# Patient Record
Sex: Female | Born: 1945 | Race: White | Hispanic: No | Smoking: Never smoker
Health system: Southern US, Community
[De-identification: ages and names within clinical notes are randomized; demographics above are authoritative.]

## PROBLEM LIST (undated history)

## (undated) DIAGNOSIS — E785 Hyperlipidemia, unspecified: Secondary | ICD-10-CM

## (undated) DIAGNOSIS — I10 Essential (primary) hypertension: Secondary | ICD-10-CM

## (undated) DIAGNOSIS — F419 Anxiety disorder, unspecified: Secondary | ICD-10-CM

## (undated) DIAGNOSIS — F988 Other specified behavioral and emotional disorders with onset usually occurring in childhood and adolescence: Secondary | ICD-10-CM

## (undated) DIAGNOSIS — Z8249 Family history of ischemic heart disease and other diseases of the circulatory system: Secondary | ICD-10-CM

## (undated) DIAGNOSIS — Z973 Presence of spectacles and contact lenses: Secondary | ICD-10-CM

## (undated) DIAGNOSIS — Z9289 Personal history of other medical treatment: Secondary | ICD-10-CM

## (undated) HISTORY — PX: MOUTH SURGERY: SHX715

## (undated) HISTORY — DX: Hyperlipidemia, unspecified: E78.5

## (undated) HISTORY — DX: Essential (primary) hypertension: I10

## (undated) HISTORY — DX: Family history of ischemic heart disease and other diseases of the circulatory system: Z82.49

## (undated) HISTORY — DX: Personal history of other medical treatment: Z92.89

## (undated) HISTORY — PX: TUBAL LIGATION: SHX77

## (undated) HISTORY — PX: COLONOSCOPY: SHX174

## (undated) HISTORY — DX: Other specified behavioral and emotional disorders with onset usually occurring in childhood and adolescence: F98.8

## (undated) HISTORY — DX: Anxiety disorder, unspecified: F41.9

---

## 1999-06-28 ENCOUNTER — Other Ambulatory Visit: Admission: RE | Admit: 1999-06-28 | Discharge: 1999-06-28 | Payer: Self-pay | Admitting: Gynecology

## 1999-12-14 ENCOUNTER — Emergency Department (HOSPITAL_COMMUNITY): Admission: EM | Admit: 1999-12-14 | Discharge: 1999-12-14 | Payer: Self-pay | Admitting: Emergency Medicine

## 1999-12-17 ENCOUNTER — Emergency Department (HOSPITAL_COMMUNITY): Admission: EM | Admit: 1999-12-17 | Discharge: 1999-12-17 | Payer: Self-pay | Admitting: Emergency Medicine

## 1999-12-21 ENCOUNTER — Encounter (HOSPITAL_COMMUNITY): Admission: RE | Admit: 1999-12-21 | Discharge: 2000-03-20 | Payer: Self-pay | Admitting: Emergency Medicine

## 2000-03-16 ENCOUNTER — Other Ambulatory Visit: Admission: RE | Admit: 2000-03-16 | Discharge: 2000-03-16 | Payer: Self-pay | Admitting: Gynecology

## 2000-08-20 ENCOUNTER — Encounter: Payer: Self-pay | Admitting: Orthopedic Surgery

## 2000-08-20 ENCOUNTER — Ambulatory Visit (HOSPITAL_COMMUNITY): Admission: RE | Admit: 2000-08-20 | Discharge: 2000-08-20 | Payer: Self-pay | Admitting: Orthopedic Surgery

## 2001-03-12 ENCOUNTER — Other Ambulatory Visit: Admission: RE | Admit: 2001-03-12 | Discharge: 2001-03-12 | Payer: Self-pay | Admitting: Gynecology

## 2001-04-09 ENCOUNTER — Ambulatory Visit (HOSPITAL_BASED_OUTPATIENT_CLINIC_OR_DEPARTMENT_OTHER): Admission: RE | Admit: 2001-04-09 | Discharge: 2001-04-09 | Payer: Self-pay | Admitting: Orthopedic Surgery

## 2002-05-12 ENCOUNTER — Other Ambulatory Visit: Admission: RE | Admit: 2002-05-12 | Discharge: 2002-05-12 | Payer: Self-pay | Admitting: Gynecology

## 2003-01-30 ENCOUNTER — Ambulatory Visit (HOSPITAL_COMMUNITY): Admission: RE | Admit: 2003-01-30 | Discharge: 2003-01-30 | Payer: Self-pay | Admitting: Gastroenterology

## 2003-06-11 ENCOUNTER — Ambulatory Visit (HOSPITAL_COMMUNITY): Admission: RE | Admit: 2003-06-11 | Discharge: 2003-06-11 | Payer: Self-pay | Admitting: Gynecology

## 2003-07-15 ENCOUNTER — Other Ambulatory Visit: Admission: RE | Admit: 2003-07-15 | Discharge: 2003-07-15 | Payer: Self-pay | Admitting: Gynecology

## 2003-07-25 HISTORY — PX: TRIGGER FINGER RELEASE: SHX641

## 2004-07-22 ENCOUNTER — Other Ambulatory Visit: Admission: RE | Admit: 2004-07-22 | Discharge: 2004-07-22 | Payer: Self-pay | Admitting: Gynecology

## 2005-06-29 ENCOUNTER — Other Ambulatory Visit: Admission: RE | Admit: 2005-06-29 | Discharge: 2005-06-29 | Payer: Self-pay | Admitting: Gynecology

## 2006-04-17 ENCOUNTER — Other Ambulatory Visit: Admission: RE | Admit: 2006-04-17 | Discharge: 2006-04-17 | Payer: Self-pay | Admitting: Gynecology

## 2010-10-23 DIAGNOSIS — Z9289 Personal history of other medical treatment: Secondary | ICD-10-CM

## 2010-10-23 HISTORY — DX: Personal history of other medical treatment: Z92.89

## 2011-08-02 ENCOUNTER — Encounter: Payer: Self-pay | Admitting: Obstetrics and Gynecology

## 2013-04-22 ENCOUNTER — Encounter: Payer: Self-pay | Admitting: Cardiovascular Disease

## 2013-04-24 ENCOUNTER — Telehealth: Payer: Self-pay | Admitting: *Deleted

## 2013-04-24 ENCOUNTER — Telehealth: Payer: Self-pay | Admitting: Cardiovascular Disease

## 2013-04-24 NOTE — Telephone Encounter (Signed)
Returned patient's phone call in reference to her wellbutrin request. Informed her that Dr. Alanda Amass is no longer available therefore we cannot change her medication to brand medication. A doctor would need to do this. She requested for JC to do this . I informed her that JC could not do this. She will need to come in for appointment to see someone here or go to her PCP.

## 2013-04-24 NOTE — Telephone Encounter (Signed)
Wants a prescription called in to [facility] for the brand name Wellbutrin  300mg . She has the generic form of that and its not working . Wants to try to name brand.. Please call fher at [phone].Marland Kitchen

## 2013-06-13 ENCOUNTER — Telehealth: Payer: Self-pay | Admitting: Cardiovascular Disease

## 2013-06-13 NOTE — Telephone Encounter (Signed)
Calling about Simvastatin about the dosage and also have other questions .Marland Kitchen Would like to speak to [facility] ... Please call   Thanks

## 2013-06-13 NOTE — Telephone Encounter (Signed)
Returned call.  Left message to call back Monday before 4pm.  JC, LPN not available.

## 2013-07-22 ENCOUNTER — Other Ambulatory Visit: Payer: Self-pay | Admitting: *Deleted

## 2013-07-22 ENCOUNTER — Telehealth: Payer: Self-pay | Admitting: Cardiovascular Disease

## 2013-07-22 ENCOUNTER — Telehealth: Payer: Self-pay | Admitting: *Deleted

## 2013-07-22 MED ORDER — VALSARTAN 160 MG PO TABS: 80.0000 mg | ORAL_TABLET | Freq: Every day | ORAL | Status: DC

## 2013-07-22 MED ORDER — SIMVASTATIN 40 MG PO TABS: 60.0000 mg | ORAL_TABLET | Freq: Every day | ORAL | Status: DC

## 2013-07-22 NOTE — Telephone Encounter (Signed)
Please have J C To call her-wants to make sure he got her refill from the pharmacist.Need to have it filled by tomorrow.

## 2013-07-22 NOTE — Telephone Encounter (Signed)
Spoke with patient to clarify med doses. Also stated needed simvastatin refill.   Requested Ambien (paper fax) and Wellbutrin (while on phone) and instructed patient that new cardiologist may not prescribe these and should likely be defered to PCP.

## 2013-07-22 NOTE — Telephone Encounter (Signed)
Left VM for patient to call back with diovan dose for refill.. Noted in charge 80mg  QD, refill came in for 160mg  QD

## 2013-07-23 ENCOUNTER — Other Ambulatory Visit: Payer: Self-pay | Admitting: *Deleted

## 2013-07-23 NOTE — Telephone Encounter (Signed)
Per Medical Records: "Chart [id number] RX received from pharmacy, will locate chart and put on RX cart. ST"  Message forwarded to [facility]. Berlinda Last, LPN.

## 2013-07-23 NOTE — Telephone Encounter (Signed)
On Diovan Rx sent to [facility] yesterday the dosage is wrong.  Please check and correct.  Please call

## 2013-07-23 NOTE — Telephone Encounter (Signed)
Pt. Called no answer left message on machine

## 2013-07-23 NOTE — Telephone Encounter (Signed)
Returned call and pt verified x 2.  Pt informed message received and per note on yesterday, she spoke w/ Eileen Stanford, RN who clarified current dose.  Pt stated she has to take the brand name and it cost $140.  Stated JC usually sends in script for 160 mg daily (Diovan).  Pt informed RN can only send in script for current dose and cannot change directions on Rx w/o an order.  Pt would like JC to be notified to send in script for 160 mg daily.  Pt informed he will be notified.  Pt verbalized understanding and agreed w/ plan.  Message forwarded to [facility]. Berlinda Last, LPN.

## 2013-10-02 ENCOUNTER — Telehealth: Payer: Self-pay | Admitting: Gynecology

## 2013-10-02 NOTE — Telephone Encounter (Signed)
LMTCB for appt.  (Overdue for Aex. Last was 06-14-12 with TL.)

## 2013-10-02 NOTE — Telephone Encounter (Signed)
Patient requesting an RX from Dr. Farrel GobbleLathrop for Diovan 160 mg to be taken once daily. She used to get this RX from Dr. Jolayne HainesWeintraud but he recently retired. Patient states she is almost out and needs this as soon as possible in 90 day increments.   [facility]  [place]

## 2013-10-02 NOTE — Telephone Encounter (Signed)
Spoke with pt who has Aex appt scheduled for 10-13-13 at 6 pm with TL. Pt is about to run out of Diovan 160 mg and would like TL to refill if possible. Pt states, "Dr. Farrel GobbleLathrop knows about this and that Dr. Alanda AmassWeintraub is retired."  Dr. Farrel GobbleLathrop, do you want to refill? Pt would like 90 day refill.

## 2013-10-06 ENCOUNTER — Other Ambulatory Visit: Payer: Self-pay | Admitting: Orthopedic Surgery

## 2013-10-06 NOTE — Telephone Encounter (Signed)
As i can tell, she got a 90d rx from Dr Allyson SabalBerry in December and there were 2 refills, so she should have enough.  I am not a cardiologist and would have filled her so she didn't have a gap but Dr Allyson SabalBerry is cardiology, so she should refer to him.  This is not an Rx i have ever prescribed.

## 2013-10-06 NOTE — Telephone Encounter (Signed)
Spoke with pt about refills from Dr. Allyson SabalBerry, and pt not aware as she has never seen him before. Pt will call about refills and is appreciative.

## 2013-10-08 ENCOUNTER — Encounter: Payer: Self-pay | Admitting: Gynecology

## 2013-10-13 ENCOUNTER — Encounter: Payer: Self-pay | Admitting: Gynecology

## 2013-10-13 ENCOUNTER — Ambulatory Visit (INDEPENDENT_AMBULATORY_CARE_PROVIDER_SITE_OTHER): Payer: Medicare Other | Admitting: Gynecology

## 2013-10-13 VITALS — BP 138/70 | HR 71 | Resp 20 | Ht 63.5 in | Wt 195.0 lb

## 2013-10-13 DIAGNOSIS — Z780 Asymptomatic menopausal state: Secondary | ICD-10-CM

## 2013-10-13 DIAGNOSIS — G4700 Insomnia, unspecified: Secondary | ICD-10-CM

## 2013-10-13 DIAGNOSIS — R635 Abnormal weight gain: Secondary | ICD-10-CM

## 2013-10-13 DIAGNOSIS — Z124 Encounter for screening for malignant neoplasm of cervix: Secondary | ICD-10-CM

## 2013-10-13 DIAGNOSIS — E7800 Pure hypercholesterolemia, unspecified: Secondary | ICD-10-CM | POA: Insufficient documentation

## 2013-10-13 DIAGNOSIS — Z01419 Encounter for gynecological examination (general) (routine) without abnormal findings: Secondary | ICD-10-CM

## 2013-10-13 DIAGNOSIS — F4323 Adjustment disorder with mixed anxiety and depressed mood: Secondary | ICD-10-CM

## 2013-10-13 DIAGNOSIS — Z0000 Encounter for general adult medical examination without abnormal findings: Secondary | ICD-10-CM

## 2013-10-13 DIAGNOSIS — I10 Essential (primary) hypertension: Secondary | ICD-10-CM

## 2013-10-13 LAB — POCT URINALYSIS DIPSTICK
Bilirubin, UA: NEGATIVE
Glucose, UA: NEGATIVE
Ketones, UA: NEGATIVE
NITRITE UA: NEGATIVE
PROTEIN UA: NEGATIVE
UROBILINOGEN UA: NEGATIVE
pH, UA: 5

## 2013-10-13 MED ORDER — BUPROPION HCL ER (XL) 300 MG PO TB24: 300.0000 mg | ORAL_TABLET | Freq: Every day | ORAL | Status: DC

## 2013-10-13 MED ORDER — VALSARTAN 160 MG PO TABS: 160.0000 mg | ORAL_TABLET | Freq: Every day | ORAL | Status: DC

## 2013-10-13 MED ORDER — ZOLPIDEM TARTRATE 10 MG PO TABS: 10.0000 mg | ORAL_TABLET | Freq: Every evening | ORAL | Status: DC | PRN

## 2013-10-13 NOTE — Patient Instructions (Signed)

## 2013-10-13 NOTE — Progress Notes (Signed)
68 y.o. @Divorsed  @Caucasian  female   [phone] here for annual exam. Pt reports menses are not regular.  She does not report hot flashes, does not have night sweats, does not have vaginal dryness.  She is not using lubricants.  She does not report post-menopasual bleeding.  Pt reports that she does not have refills on her medications since her cardiologist retired, she brought in the bottles to show the doses.  Pt requests fasting labs.  She has been under a lot of stress with her son who has been repeatedly hospitalized over the last few weeks.  No LMP recorded. Patient is postmenopausal.          Sexually active: no  The current method of family planning is none.    Exercising: yes  Cardio Last pap: 05/2012. HR HPV not tested Abnormal PAP: yes Mammogram: 12/14 - BI-RADS 1 : NEG BSE: None Colonoscopy: 2011 - every 7 years DEXA: 2010 Alcohol: none Tobacco: none  Health Maintenance  Topic Date Due  . Zostavax  06/07/2004  . Pneumococcal Polysaccharide Vaccine Age 57 And Over  06/07/2009  . Influenza Vaccine  02/21/2013  . Mammogram  07/02/2015  . Colonoscopy  07/25/2019  . Tetanus/tdap  06/14/2022    Family History  Problem Relation Age of Onset  . Non-Hodgkin's lymphoma    . Diabetes Mellitus II    . Diabetes Mother     There are no active problems to display for this patient.   Past Medical History  Diagnosis Date  . Hypertension   . Anxiety   . Hypertension     History reviewed. No pertinent past surgical history.  Allergies: Tetracyclines & related  Current Outpatient Prescriptions  Medication Sig Dispense Refill  . buPROPion (WELLBUTRIN XL) 300 MG 24 hr tablet Take 300 mg by mouth daily.      . calcium gluconate 650 MG tablet Take 650 mg by mouth daily.      . simvastatin (ZOCOR) 40 MG tablet Take 1.5 tablets (60 mg total) by mouth at bedtime.  135 tablet  2  . valsartan (DIOVAN) 160 MG tablet Take 0.5 tablets (80 mg total) by mouth daily.  45 tablet  2  .  ergocalciferol (VITAMIN D2) 50000 UNITS capsule Take 50,000 Units by mouth once a week.      . fluticasone (CUTIVATE) 0.005 % ointment Apply 1 application topically daily.        No current facility-administered medications for this visit.    ROS: Pertinent items are noted in HPI.  Exam:    BP 158/68  Pulse 71  Resp 20  Ht 5' 3.5" (1.613 m)  Wt 195 lb (88.451 kg)  BMI 34.00 kg/m2 Weight change: @WEIGHTCHANGE @ Last 3 height recordings:  Ht Readings from Last 3 Encounters:  10/13/13 5' 3.5" (1.613 m)   General appearance: alert, cooperative and appears stated age Head: Normocephalic, without obvious abnormality, atraumatic Neck: no adenopathy, no carotid bruit, no JVD, supple, symmetrical, trachea midline and thyroid not enlarged, symmetric, no tenderness/mass/nodules Lungs: clear to auscultation bilaterally Breasts: normal appearance, no masses or tenderness Heart: regular rate and rhythm, S1, S2 normal, no murmur, click, rub or gallop Abdomen: soft, non-tender; bowel sounds normal; no masses,  no organomegaly Extremities: extremities normal, atraumatic, no cyanosis or edema Skin: Skin color, texture, turgor normal. No rashes or lesions Lymph nodes: Cervical, supraclavicular, and axillary nodes normal. no inguinal nodes palpated Neurologic: Grossly normal   Pelvic: External genitalia:  no lesions  Urethra: normal appearing urethra with no masses, tenderness or lesions              Bartholins and Skenes: normal                 Vagina: normal appearing vagina with normal color and discharge, no lesions              Cervix: normal appearance              Pap taken: no        Bimanual Exam:  Uterus:  uterus is normal size, shape, consistency and nontender                                      Adnexa:    no masses                                      Rectovaginal: Confirms                                      Anus:  normal sphincter tone, no lesions  A: well  woman HTN Hypercholesterolemia Family history of heart disease and early death     P: mammogram pap smear not done Repeat BP at end of visit 138/ Refill of diovan, wellbutrin and ambien sent in  rto for fasting LP, CBC, CMP DEXA counseled on breast self exam, mammography screening, use and side effects of OCP's, adequate intake of calcium and vitamin D, diet and exercise return annually or prn Discussed PAP guideline changes, importance of weight bearing exercises, calcium, vit D and balanced diet.  An After Visit Summary was printed and given to the patient.

## 2013-10-17 ENCOUNTER — Other Ambulatory Visit (INDEPENDENT_AMBULATORY_CARE_PROVIDER_SITE_OTHER): Payer: Medicare Other

## 2013-10-17 DIAGNOSIS — E7800 Pure hypercholesterolemia, unspecified: Secondary | ICD-10-CM

## 2013-10-17 DIAGNOSIS — R635 Abnormal weight gain: Secondary | ICD-10-CM

## 2013-10-17 LAB — COMPREHENSIVE METABOLIC PANEL
ALT: 19 U/L (ref 0–35)
AST: 19 U/L (ref 0–37)
Albumin: 3.9 g/dL (ref 3.5–5.2)
Alkaline Phosphatase: 79 U/L (ref 39–117)
BILIRUBIN TOTAL: 0.3 mg/dL (ref 0.2–1.2)
BUN: 13 mg/dL (ref 6–23)
CO2: 28 meq/L (ref 19–32)
CREATININE: 0.77 mg/dL (ref 0.50–1.10)
Calcium: 9.5 mg/dL (ref 8.4–10.5)
Chloride: 102 mEq/L (ref 96–112)
Glucose, Bld: 84 mg/dL (ref 70–99)
Potassium: 4.7 mEq/L (ref 3.5–5.3)
Sodium: 139 mEq/L (ref 135–145)
Total Protein: 6.8 g/dL (ref 6.0–8.3)

## 2013-10-17 LAB — HEPATIC FUNCTION PANEL
ALT: 19 U/L (ref 0–35)
AST: 19 U/L (ref 0–37)
Albumin: 3.9 g/dL (ref 3.5–5.2)
Alkaline Phosphatase: 79 U/L (ref 39–117)
Bilirubin, Direct: 0.1 mg/dL (ref 0.0–0.3)
Indirect Bilirubin: 0.2 mg/dL (ref 0.2–1.2)
TOTAL PROTEIN: 6.8 g/dL (ref 6.0–8.3)
Total Bilirubin: 0.3 mg/dL (ref 0.2–1.2)

## 2013-10-17 LAB — LIPID PANEL
CHOL/HDL RATIO: 2.8 ratio
Cholesterol: 141 mg/dL (ref 0–200)
HDL: 51 mg/dL (ref >39)
LDL Cholesterol: 67 mg/dL (ref 0–99)
Triglycerides: 116 mg/dL (ref <150)
VLDL: 23 mg/dL (ref 0–40)

## 2013-10-17 MED ORDER — ZOLPIDEM TARTRATE 10 MG PO TABS: 10.0000 mg | ORAL_TABLET | Freq: Every evening | ORAL | Status: DC | PRN

## 2013-10-17 NOTE — Addendum Note (Signed)
Addended by: Douglass RiversLATHROP, Mercedes Fort on: 10/17/2013 09:05 AM   Modules accepted: Orders, Medications

## 2013-10-18 LAB — TSH: TSH: 3.293 u[IU]/mL (ref 0.350–4.500)

## 2013-10-20 ENCOUNTER — Telehealth: Payer: Self-pay | Admitting: *Deleted

## 2013-10-20 NOTE — Telephone Encounter (Signed)
Left Message To Call Back  

## 2013-10-20 NOTE — Telephone Encounter (Signed)
Message copied by Lorraine LaxSHAW, Arley Garant J on Mon Oct 20, 2013  2:22 PM ------      Message from: Douglass RiversLATHROP, TRACY      Created: Mon Oct 20, 2013  1:17 PM       CMET, TSH, CHOL AND LFT'S all excellent, pls inform ------

## 2013-10-27 NOTE — Telephone Encounter (Signed)
Patient notified see result note 

## 2013-12-02 ENCOUNTER — Telehealth: Payer: Self-pay | Admitting: Cardiovascular Disease

## 2013-12-02 MED ORDER — SIMVASTATIN 40 MG PO TABS: ORAL_TABLET | ORAL | Status: DC

## 2013-12-02 MED ORDER — SIMVASTATIN 20 MG PO TABS: ORAL_TABLET | ORAL | Status: DC

## 2013-12-02 NOTE — Telephone Encounter (Signed)
Her insurance is now  Requiring  2 prescriptions for her Simvastatin. She needs 2 different prescriptions, one for 20 mg and one for 40 mg please. Please call.

## 2013-12-02 NOTE — Telephone Encounter (Signed)
Patient takes 60mg  simvastatin, Rx'ed by Dr. Alanda AmassWeintraub. Insurance will not longer cover 1.5 tablets of 40mg  daily, so she needs a Rx for 40mg  and Rx for 20mg .  She will be seeing Dr. Allyson SabalBerry and per patient she is a yearly visit patient (no recall in system so will make scheduler aware)

## 2013-12-04 ENCOUNTER — Other Ambulatory Visit: Payer: Self-pay | Admitting: *Deleted

## 2013-12-04 NOTE — Telephone Encounter (Signed)
Pharmacy calling to get an order for the Simvastatin 40mg  daily.  Already received the refill for the 20mg .

## 2013-12-11 ENCOUNTER — Telehealth: Payer: Self-pay | Admitting: Cardiovascular Disease

## 2013-12-11 NOTE — Telephone Encounter (Signed)
Closed encounter °

## 2013-12-12 ENCOUNTER — Other Ambulatory Visit: Payer: Self-pay | Admitting: *Deleted

## 2013-12-12 DIAGNOSIS — I10 Essential (primary) hypertension: Secondary | ICD-10-CM

## 2013-12-12 NOTE — Telephone Encounter (Addendum)
[facility] requesting Valsartan.  Last AEX and refill 10/13/2013 #90/ 0 refills  Please approve or deny Rx.

## 2013-12-16 NOTE — Telephone Encounter (Signed)
Patient states she has enough Rx until her appt with new cardiologist.  - Patient requesting call from Dr. Farrel Gobble. She states it is something personal.

## 2013-12-16 NOTE — Telephone Encounter (Signed)
This medication is not appropriate for me to refill, done as courtesy as her cardiologist had retired and she had no refill, needs to contact prior office for new cardiologist to see

## 2014-04-12 ENCOUNTER — Other Ambulatory Visit: Payer: Self-pay | Admitting: Cardiovascular Disease

## 2014-04-13 NOTE — Telephone Encounter (Signed)
Spoke to patient she stated she needed simvastatin refilled.Advised she has not seen a Dr.since Dr.Weintraub retired.Pt requested Dr.Berry.Appointment scheduled with Dr.Berry 06/12/14 at 11:30 am. Pt will come fasting to appt so she can have lab work too.Simvastatin refill sent to pharmacy.

## 2014-04-14 ENCOUNTER — Other Ambulatory Visit: Payer: Self-pay | Admitting: *Deleted

## 2014-04-30 ENCOUNTER — Other Ambulatory Visit: Payer: Self-pay | Admitting: *Deleted

## 2014-04-30 MED ORDER — SIMVASTATIN 40 MG PO TABS: ORAL_TABLET | ORAL | Status: DC

## 2014-04-30 NOTE — Telephone Encounter (Signed)
Medication quanbtity adjusted per request and sent electronically to the pharmacy with notation: MUST keep appointment for additional refills.

## 2014-05-25 ENCOUNTER — Encounter: Payer: Self-pay | Admitting: Gynecology

## 2014-05-26 ENCOUNTER — Other Ambulatory Visit: Payer: Self-pay | Admitting: Cardiovascular Disease

## 2014-06-10 ENCOUNTER — Encounter: Payer: Self-pay | Admitting: *Deleted

## 2014-06-12 ENCOUNTER — Ambulatory Visit (INDEPENDENT_AMBULATORY_CARE_PROVIDER_SITE_OTHER): Payer: Medicare Other | Admitting: Cardiovascular Disease

## 2014-06-12 ENCOUNTER — Encounter: Payer: Self-pay | Admitting: Cardiovascular Disease

## 2014-06-12 VITALS — BP 158/62 | HR 72 | Ht 64.0 in | Wt 196.0 lb

## 2014-06-12 DIAGNOSIS — Z8249 Family history of ischemic heart disease and other diseases of the circulatory system: Secondary | ICD-10-CM

## 2014-06-12 DIAGNOSIS — G4700 Insomnia, unspecified: Secondary | ICD-10-CM

## 2014-06-12 DIAGNOSIS — F4323 Adjustment disorder with mixed anxiety and depressed mood: Secondary | ICD-10-CM

## 2014-06-12 DIAGNOSIS — E7800 Pure hypercholesterolemia, unspecified: Secondary | ICD-10-CM

## 2014-06-12 DIAGNOSIS — I10 Essential (primary) hypertension: Secondary | ICD-10-CM

## 2014-06-12 MED ORDER — SIMVASTATIN 40 MG PO TABS: ORAL_TABLET | ORAL | Status: DC

## 2014-06-12 MED ORDER — ZOLPIDEM TARTRATE 10 MG PO TABS: 10.0000 mg | ORAL_TABLET | Freq: Every evening | ORAL | Status: DC | PRN

## 2014-06-12 MED ORDER — SIMVASTATIN 20 MG PO TABS: ORAL_TABLET | ORAL | Status: DC

## 2014-06-12 MED ORDER — VALSARTAN 160 MG PO TABS: 160.0000 mg | ORAL_TABLET | Freq: Every day | ORAL | Status: DC

## 2014-06-12 MED ORDER — BUPROPION HCL ER (XL) 300 MG PO TB24: 300.0000 mg | ORAL_TABLET | Freq: Every day | ORAL | Status: AC

## 2014-06-12 NOTE — Patient Instructions (Signed)
Your physician wants you to follow-up in 6 months with Dr. Berry. You will receive a reminder letter in the mail 2 months in advance. If you do not receive a letter, please call our office to schedule the follow-up appointment.  

## 2014-06-12 NOTE — Assessment & Plan Note (Signed)
She has a strong family history of heart disease with a brother that died at age 70 of a myocardial infarction and a mother who had an MI as well. She has never had a heart attack or stroke and denies chest pain or shortness of breath. She has had several negative stress test in the past.

## 2014-06-12 NOTE — Assessment & Plan Note (Signed)
History of hypokalemia on simvastatin 40 mg daily. Her recent lipid profile performed 10/17/13 revealed a total cholesterol of 141, LDL 67 and HDL of 51.

## 2014-06-12 NOTE — Progress Notes (Signed)
06/12/2014 Tiffany ScoreArlene Dugue   1946-02-04  [id number]  Primary Physician No PCP Per Patient Primary Cardiologist: Runell GessJonathan J. Berry MD Roseanne RenoFACP,FACC,FAHA, FSCAI   HPI:  Tiffany Prince is a very pleasant 68 year old moderately overweight single Caucasian female mother of 3 children, grandmother and 4 grandchildren who works as an Advertising account plannerinsurance agent. She was. A patient of Dr. Rocco Sereneichard Weintraub's. I'm assuming her care. She has no past medical cardiac history. Her risk factors include treated hypertension and hyperlipidemia. She does have a family history of heart disease with a brother who died of a myocardial infarction at age 744 and a mother who had an MI as well. She has never had a heart attack or stroke. She denies chest pain or shortness of breath. Her last Myoview stress test performed 04/15/10 was nonischemic.   Current Outpatient Prescriptions  Medication Sig Dispense Refill  . buPROPion (WELLBUTRIN XL) 300 MG 24 hr tablet Take 1 tablet (300 mg total) by mouth daily. 90 tablet 3  . calcium gluconate 650 MG tablet Take 650 mg by mouth daily.    . diclofenac (VOLTAREN) 75 MG EC tablet     . ergocalciferol (VITAMIN D2) 50000 UNITS capsule Take 50,000 Units by mouth once a week.    . fluticasone (CUTIVATE) 0.005 % ointment Apply 1 application topically daily.     . simvastatin (ZOCOR) 20 MG tablet Take 1 tablet (20mg ) in addition to 1-40mg  tablet (60mg  total) once daily by mouth for cholesterol. 90 tablet 1  . simvastatin (ZOCOR) 40 MG tablet Take one tablet by mouth one time daily. MUST keep appointment for additional refills. 90 tablet 0  . valsartan (DIOVAN) 160 MG tablet Take 1 tablet (160 mg total) by mouth daily. 90 tablet 0  . zolpidem (AMBIEN) 10 MG tablet Take 1 tablet (10 mg total) by mouth at bedtime as needed for sleep. 90 tablet 1   No current facility-administered medications for this visit.    Allergies  Allergen Reactions  . Tetracyclines & Related     History   Social History   . Marital Status: Divorced    Spouse Name: N/A    Number of Children: N/A  . Years of Education: N/A   Occupational History  . Not on file.   Social History Main Topics  . Smoking status: Never Smoker   . Smokeless tobacco: Never Used     Comment: pt stated that she smoked 50 years ago, and barely  . Alcohol Use: No  . Drug Use: No  . Sexual Activity: Not Currently   Other Topics Concern  . Not on file   Social History Narrative     Review of Systems: General: negative for chills, fever, night sweats or weight changes.  Cardiovascular: negative for chest pain, dyspnea on exertion, edema, orthopnea, palpitations, paroxysmal nocturnal dyspnea or shortness of breath Dermatological: negative for rash Respiratory: negative for cough or wheezing Urologic: negative for hematuria Abdominal: negative for nausea, vomiting, diarrhea, bright red blood per rectum, melena, or hematemesis Neurologic: negative for visual changes, syncope, or dizziness All other systems reviewed and are otherwise negative except as noted above.    Blood pressure 158/62, pulse 72, height 5\' 4"  (1.626 m), weight 196 lb (88.905 kg).  General appearance: alert and no distress Neck: no adenopathy, no carotid bruit, no JVD, supple, symmetrical, trachea midline and thyroid not enlarged, symmetric, no tenderness/mass/nodules Lungs: clear to auscultation bilaterally Heart: regular rate and rhythm, S1, S2 normal, no murmur, click, rub or gallop  Extremities: extremities normal, atraumatic, no cyanosis or edema and 2+ pedal pulses bilaterally  EKG normal sinus rhythm at 72 without ST or T-wave changes. I personally reviewed this EKG  ASSESSMENT AND PLAN:   Essential hypertension, benign History of hypertension with blood pressure measured today 150/62. She is on valsartan 160 mg. Continue patient at current dosing  Pure hypercholesterolemia History of hypokalemia on simvastatin 40 mg daily. Her recent lipid  profile performed 10/17/13 revealed a total cholesterol of 141, LDL 67 and HDL of 51.  Family history of heart disease She has a strong family history of heart disease with a brother that died at age 70 of a myocardial infarction and a mother who had an MI as well. She has never had a heart attack or stroke and denies chest pain or shortness of breath. She has had several negative stress test in the past.      Runell GessJonathan J. Berry MD [facility] LLCFACP,FACC,FAHA, [facility] HospitalFSCAI 06/12/2014 12:03 PM

## 2014-06-12 NOTE — Assessment & Plan Note (Signed)
History of hypertension with blood pressure measured today 150/62. She is on valsartan 160 mg. Continue patient at current dosing

## 2014-07-02 ENCOUNTER — Other Ambulatory Visit: Payer: Self-pay | Admitting: Orthopedic Surgery

## 2014-07-06 ENCOUNTER — Encounter (HOSPITAL_BASED_OUTPATIENT_CLINIC_OR_DEPARTMENT_OTHER): Payer: Self-pay | Admitting: *Deleted

## 2014-07-06 NOTE — Progress Notes (Signed)
No pcp-sees dr berry for htn Stress test 9/11 normal Needs istat Pt very busy insurance agant

## 2014-07-07 ENCOUNTER — Ambulatory Visit (HOSPITAL_BASED_OUTPATIENT_CLINIC_OR_DEPARTMENT_OTHER): Payer: Medicare Other | Admitting: Anesthesiology

## 2014-07-07 ENCOUNTER — Encounter (HOSPITAL_BASED_OUTPATIENT_CLINIC_OR_DEPARTMENT_OTHER): Payer: Self-pay | Admitting: Orthopedic Surgery

## 2014-07-07 ENCOUNTER — Encounter (HOSPITAL_BASED_OUTPATIENT_CLINIC_OR_DEPARTMENT_OTHER)
Admission: RE | Disposition: A | Discharge status: Home / Self Care | Payer: Self-pay | Source: Ambulatory Visit | Attending: Orthopedic Surgery

## 2014-07-07 ENCOUNTER — Ambulatory Visit (HOSPITAL_BASED_OUTPATIENT_CLINIC_OR_DEPARTMENT_OTHER)
Admission: RE | Admit: 2014-07-07 | Discharge: 2014-07-07 | Disposition: A | Discharge status: Home / Self Care | Payer: Medicare Other | Source: Ambulatory Visit | Attending: Orthopedic Surgery | Admitting: Orthopedic Surgery

## 2014-07-07 DIAGNOSIS — Z8249 Family history of ischemic heart disease and other diseases of the circulatory system: Secondary | ICD-10-CM | POA: Insufficient documentation

## 2014-07-07 DIAGNOSIS — F4323 Adjustment disorder with mixed anxiety and depressed mood: Secondary | ICD-10-CM | POA: Diagnosis not present

## 2014-07-07 DIAGNOSIS — M5430 Sciatica, unspecified side: Secondary | ICD-10-CM | POA: Diagnosis not present

## 2014-07-07 DIAGNOSIS — I10 Essential (primary) hypertension: Secondary | ICD-10-CM | POA: Diagnosis not present

## 2014-07-07 DIAGNOSIS — Z880 Allergy status to penicillin: Secondary | ICD-10-CM | POA: Insufficient documentation

## 2014-07-07 DIAGNOSIS — G4700 Insomnia, unspecified: Secondary | ICD-10-CM | POA: Diagnosis not present

## 2014-07-07 DIAGNOSIS — M65842 Other synovitis and tenosynovitis, left hand: Secondary | ICD-10-CM | POA: Insufficient documentation

## 2014-07-07 DIAGNOSIS — Z833 Family history of diabetes mellitus: Secondary | ICD-10-CM | POA: Insufficient documentation

## 2014-07-07 DIAGNOSIS — F419 Anxiety disorder, unspecified: Secondary | ICD-10-CM | POA: Insufficient documentation

## 2014-07-07 DIAGNOSIS — E785 Hyperlipidemia, unspecified: Secondary | ICD-10-CM | POA: Diagnosis not present

## 2014-07-07 DIAGNOSIS — M659 Synovitis and tenosynovitis, unspecified: Secondary | ICD-10-CM | POA: Insufficient documentation

## 2014-07-07 DIAGNOSIS — M65342 Trigger finger, left ring finger: Secondary | ICD-10-CM | POA: Diagnosis present

## 2014-07-07 HISTORY — DX: Presence of spectacles and contact lenses: Z97.3

## 2014-07-07 HISTORY — PX: TRIGGER FINGER RELEASE: SHX641

## 2014-07-07 LAB — POCT I-STAT, CHEM 8
BUN: 18 mg/dL (ref 6–23)
CALCIUM ION: 1.24 mmol/L (ref 1.13–1.30)
Chloride: 103 mEq/L (ref 96–112)
Creatinine, Ser: 0.9 mg/dL (ref 0.50–1.10)
Glucose, Bld: 95 mg/dL (ref 70–99)
HEMATOCRIT: 42 % (ref 36.0–46.0)
HEMOGLOBIN: 14.3 g/dL (ref 12.0–15.0)
Potassium: 4.4 mEq/L (ref 3.7–5.3)
Sodium: 138 mEq/L (ref 137–147)
TCO2: 23 mmol/L (ref 0–100)

## 2014-07-07 SURGERY — RELEASE, A1 PULLEY, FOR TRIGGER FINGER
Anesthesia: General | Site: Finger | Laterality: Left

## 2014-07-07 MED ORDER — ONDANSETRON HCL 4 MG/2ML IJ SOLN
INTRAMUSCULAR | Status: DC | PRN
  Administered 2014-07-07: 4 mg via INTRAVENOUS

## 2014-07-07 MED ORDER — CEFAZOLIN SODIUM-DEXTROSE 2-3 GM-% IV SOLR
INTRAVENOUS | Status: AC
  Filled 2014-07-07: qty 50

## 2014-07-07 MED ORDER — LIDOCAINE HCL (CARDIAC) 20 MG/ML IV SOLN
INTRAVENOUS | Status: DC | PRN
  Administered 2014-07-07: 40 mg via INTRAVENOUS

## 2014-07-07 MED ORDER — HYDROCODONE-ACETAMINOPHEN 5-325 MG PO TABS
ORAL_TABLET | ORAL | Status: AC
  Filled 2014-07-07: qty 1

## 2014-07-07 MED ORDER — MIDAZOLAM HCL 5 MG/5ML IJ SOLN
INTRAMUSCULAR | Status: DC | PRN
  Administered 2014-07-07: 1 mg via INTRAVENOUS

## 2014-07-07 MED ORDER — MIDAZOLAM HCL 2 MG/2ML IJ SOLN
INTRAMUSCULAR | Status: AC
Start: 2014-07-07 — End: 2014-07-07
  Filled 2014-07-07: qty 2

## 2014-07-07 MED ORDER — FENTANYL CITRATE 0.05 MG/ML IJ SOLN
INTRAMUSCULAR | Status: DC | PRN
  Administered 2014-07-07: 50 ug via INTRAVENOUS

## 2014-07-07 MED ORDER — CHLORHEXIDINE GLUCONATE 4 % EX LIQD: 60.0000 mL | Freq: Once | CUTANEOUS | Status: DC

## 2014-07-07 MED ORDER — HYDROMORPHONE HCL 1 MG/ML IJ SOLN: 0.2500 mg | INTRAMUSCULAR | Status: DC | PRN

## 2014-07-07 MED ORDER — MIDAZOLAM HCL 2 MG/2ML IJ SOLN: 1.0000 mg | INTRAMUSCULAR | Status: DC | PRN

## 2014-07-07 MED ORDER — CEFAZOLIN SODIUM-DEXTROSE 2-3 GM-% IV SOLR
2.0000 g | INTRAVENOUS | Status: AC
  Administered 2014-07-07: 2 g via INTRAVENOUS

## 2014-07-07 MED ORDER — DEXAMETHASONE SODIUM PHOSPHATE 10 MG/ML IJ SOLN
INTRAMUSCULAR | Status: DC | PRN
  Administered 2014-07-07: 10 mg via INTRAVENOUS

## 2014-07-07 MED ORDER — CEFAZOLIN SODIUM-DEXTROSE 2-3 GM-% IV SOLR: 2.0000 g | INTRAVENOUS | Status: DC

## 2014-07-07 MED ORDER — BUPIVACAINE HCL (PF) 0.25 % IJ SOLN
INTRAMUSCULAR | Status: AC
  Filled 2014-07-07: qty 30

## 2014-07-07 MED ORDER — HYDROCODONE-ACETAMINOPHEN 5-325 MG PO TABS
1.0000 | ORAL_TABLET | Freq: Once | ORAL | Status: AC
  Administered 2014-07-07: 1 via ORAL

## 2014-07-07 MED ORDER — FENTANYL CITRATE 0.05 MG/ML IJ SOLN
INTRAMUSCULAR | Status: AC
  Filled 2014-07-07: qty 2

## 2014-07-07 MED ORDER — FENTANYL CITRATE 0.05 MG/ML IJ SOLN: 50.0000 ug | INTRAMUSCULAR | Status: DC | PRN

## 2014-07-07 MED ORDER — PROPOFOL 10 MG/ML IV BOLUS
INTRAVENOUS | Status: DC | PRN
  Administered 2014-07-07: 120 mg via INTRAVENOUS

## 2014-07-07 MED ORDER — LACTATED RINGERS IV SOLN
INTRAVENOUS | Status: DC
  Administered 2014-07-07: 12:00:00 via INTRAVENOUS

## 2014-07-07 MED ORDER — HYDROCODONE-ACETAMINOPHEN 5-325 MG PO TABS: 1.0000 | ORAL_TABLET | Freq: Four times a day (QID) | ORAL | Status: AC | PRN

## 2014-07-07 MED ORDER — BUPIVACAINE HCL (PF) 0.25 % IJ SOLN
INTRAMUSCULAR | Status: DC | PRN
  Administered 2014-07-07: 5 mL

## 2014-07-07 SURGICAL SUPPLY — 38 items
BANDAGE COBAN STERILE 2 (GAUZE/BANDAGES/DRESSINGS) ×3 IMPLANT
BLADE SURG 15 STRL LF DISP TIS (BLADE) ×1 IMPLANT
BLADE SURG 15 STRL SS (BLADE) ×2
BNDG ESMARK 4X9 LF (GAUZE/BANDAGES/DRESSINGS) ×3 IMPLANT
CHLORAPREP W/TINT 26ML (MISCELLANEOUS) ×3 IMPLANT
CORDS BIPOLAR (ELECTRODE) IMPLANT
COVER BACK TABLE 60X90IN (DRAPES) ×3 IMPLANT
COVER MAYO STAND STRL (DRAPES) ×3 IMPLANT
CUFF TOURNIQUET SINGLE 18IN (TOURNIQUET CUFF) IMPLANT
DECANTER SPIKE VIAL GLASS SM (MISCELLANEOUS) IMPLANT
DRAPE EXTREMITY T 121X128X90 (DRAPE) ×3 IMPLANT
DRAPE SURG 17X23 STRL (DRAPES) ×3 IMPLANT
GAUZE SPONGE 4X4 12PLY STRL (GAUZE/BANDAGES/DRESSINGS) ×3 IMPLANT
GAUZE XEROFORM 1X8 LF (GAUZE/BANDAGES/DRESSINGS) ×3 IMPLANT
GLOVE BIO SURGEON STRL SZ 6.5 (GLOVE) ×2 IMPLANT
GLOVE BIO SURGEON STRL SZ7.5 (GLOVE) ×3 IMPLANT
GLOVE BIO SURGEONS STRL SZ 6.5 (GLOVE) ×1
GLOVE BIOGEL PI IND STRL 7.0 (GLOVE) ×1 IMPLANT
GLOVE BIOGEL PI IND STRL 8 (GLOVE) ×1 IMPLANT
GLOVE BIOGEL PI IND STRL 8.5 (GLOVE) ×1 IMPLANT
GLOVE BIOGEL PI INDICATOR 7.0 (GLOVE) ×2
GLOVE BIOGEL PI INDICATOR 8 (GLOVE) ×2
GLOVE BIOGEL PI INDICATOR 8.5 (GLOVE) ×2
GLOVE EXAM NITRILE LRG STRL (GLOVE) ×3 IMPLANT
GLOVE SURG ORTHO 8.0 STRL STRW (GLOVE) ×3 IMPLANT
GOWN STRL REUS W/ TWL LRG LVL3 (GOWN DISPOSABLE) ×1 IMPLANT
GOWN STRL REUS W/TWL LRG LVL3 (GOWN DISPOSABLE) ×2
GOWN STRL REUS W/TWL XL LVL3 (GOWN DISPOSABLE) ×6 IMPLANT
NEEDLE PRECISIONGLIDE 27X1.5 (NEEDLE) ×3 IMPLANT
NS IRRIG 1000ML POUR BTL (IV SOLUTION) ×3 IMPLANT
PACK BASIN DAY SURGERY FS (CUSTOM PROCEDURE TRAY) ×3 IMPLANT
STOCKINETTE 4X48 STRL (DRAPES) ×3 IMPLANT
SUT ETHILON 5 0 P 3 18 (SUTURE) ×2
SUT NYLON ETHILON 5-0 P-3 1X18 (SUTURE) ×1 IMPLANT
SYR BULB 3OZ (MISCELLANEOUS) ×3 IMPLANT
SYR CONTROL 10ML LL (SYRINGE) ×3 IMPLANT
TOWEL OR 17X24 6PK STRL BLUE (TOWEL DISPOSABLE) ×6 IMPLANT
UNDERPAD 30X30 INCONTINENT (UNDERPADS AND DIAPERS) ×3 IMPLANT

## 2014-07-07 NOTE — H&P (Signed)
Tiffany Prince is a 68 year old left hand dominant female who comes in complaining of catching of her left ring finger. This has been going on for 3 weeks. It is painful for her. She has no history of diabetes or thyroid problems. She does have a history of arthritis. There is no history of gout. She recalls no specific history of injury. She has had a trigger thumb treated by Dr. Teressa SenterSypher in 2005.   PAST MEDICAL HISTORY:  She has a history of sciatica. She is on Diclofenac, Diovan, simvastatin and Wellbutrin. She has had only the thumb surgery.  FAMILY MEDICAL HISTORY: Positive for diabetes, heart disease and high BP.  SOCIAL HISTORY:  She does not smoke. She drinks socially. 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   REVIEW OF SYSTEMS: Positive for contacts, high BP, and depression otherwise negative 14 points.  Tiffany Prince is an 68 y.o. female.   Chief Complaint: STS left ring finger HPI: see above  Past Medical History  Diagnosis Date  . Hypertension   . Anxiety   . Hypertension   . History of stress test 2008 2011    Low risk study, negative for ischemia  . ADD (attention deficit disorder)   . Hx of echocardiogram 10/2010    the left ventricle is normal size with normal left wall thickness. the left ventricle is hyperdynamic  with no regional wall motion abnormalities note. EF 65%. The transmitral spectral doppler flow pattern is normal for age with relaxation abnormality, but normal LAP suggested. The right ventricle is normal in size and function. Doppler findings do not suggest pulmonary hypertension.  . Hyperlipidemia   . Family history of heart disease   . Wears contact lenses     Past Surgical History  Procedure Laterality Date  . Tubal ligation    . Colonoscopy    . Mouth surgery      dental implant  . Trigger finger release  2005    lt thumb    Family History  Problem Relation Age of Onset  . Non-Hodgkin's lymphoma    . Diabetes Mellitus II    . Diabetes Mother     Social History:  reports that she has never smoked. She has never used smokeless tobacco. She reports that she does not drink alcohol or use illicit drugs.  Allergies:  Allergies  Allergen Reactions  . Tetracyclines & Related Shortness Of Breath    No prescriptions prior to admission    No results found for this or any previous visit (from the past 48 hour(s)).  No results found.   Pertinent items are noted in HPI.  Height 5\' 4"  (1.626 m), weight 88.451 kg (195 lb).  General appearance: alert, cooperative and appears stated age Head: Normocephalic, without obvious abnormality Neck: no carotid bruit Resp: clear to auscultation bilaterally Cardio: regular rate and rhythm, S1, S2 normal, no murmur, click, rub or gallop GI: soft, non-tender; bowel sounds normal; no masses,  no organomegaly Extremities: trigger left ringer finger Pulses: 2+ and symmetric Skin: Skin color, texture, turgor normal. No rashes or lesions Neurologic: Grossly normal Incision/Wound: na  Assessment/Plan DIAGNOSIS: STS left ring finger.  We have discussed the possibility of injections with her. She would like to have this operated on foregoing any injections. Pre, peri and post op care are discussed along with risks and complications. Patient is aware there is no guarantee with surgery, possibility of infection, injury to arteries, nerves, and tendons, incomplete relief and dystrophy. She will be  scheduled for release A-1 pulley left ring finger as an outpatient under regional anesthesia.  Akshaj Besancon R 07/07/2014, 10:41 AM

## 2014-07-07 NOTE — Anesthesia Postprocedure Evaluation (Signed)
  Anesthesia Post-op Note  Patient: Tiffany Prince  Procedure(s) Performed: Procedure(s) with comments: RELEASE A-1 LEFT RING FINGER (Left) - ANESTHESIA:  GENERAL, IV REGIONAL FAB  Patient Location: PACU  Anesthesia Type:General  Level of Consciousness: awake and alert   Airway and Oxygen Therapy: Patient Spontanous Breathing  Post-op Pain: none  Post-op Assessment: Post-op Vital signs reviewed, Patient's Cardiovascular Status Stable and Respiratory Function Stable  Post-op Vital Signs: Reviewed  Filed Vitals:   07/07/14 1345  BP: 133/57  Pulse: 71  Temp:   Resp: 15    Complications: No apparent anesthesia complications

## 2014-07-07 NOTE — Op Note (Signed)
Dictated number: [id number]

## 2014-07-07 NOTE — Anesthesia Preprocedure Evaluation (Addendum)
Anesthesia Evaluation  Patient identified by MRN, date of birth, ID band Patient awake    Reviewed: Allergy & Precautions, H&P , NPO status , Patient's Chart, lab work & pertinent test results  Airway Mallampati: II  TM Distance: >3 FB Neck ROM: Full    Dental no notable dental hx. (+) Teeth Intact, Dental Advisory Given   Pulmonary neg pulmonary ROS,  breath sounds clear to auscultation  Pulmonary exam normal       Cardiovascular hypertension, Pt. on medications Rhythm:Regular Rate:Normal     Neuro/Psych Anxiety negative neurological ROS     GI/Hepatic negative GI ROS, Neg liver ROS,   Endo/Other  negative endocrine ROS  Renal/GU negative Renal ROS  negative genitourinary   Musculoskeletal   Abdominal   Peds  (+) ATTENTION DEFICIT DISORDER WITHOUT HYPERACTIVITY Hematology negative hematology ROS (+)   Anesthesia Other Findings   Reproductive/Obstetrics negative OB ROS                            Anesthesia Physical Anesthesia Plan  ASA: II  Anesthesia Plan: General   Post-op Pain Management:    Induction: Intravenous  Airway Management Planned: LMA  Additional Equipment:   Intra-op Plan:   Post-operative Plan: Extubation in OR  Informed Consent: I have reviewed the patients History and Physical, chart, labs and discussed the procedure including the risks, benefits and alternatives for the proposed anesthesia with the patient or authorized representative who has indicated his/her understanding and acceptance.   Dental advisory given  Plan Discussed with: CRNA  Anesthesia Plan Comments:         Anesthesia Quick Evaluation

## 2014-07-07 NOTE — Anesthesia Procedure Notes (Signed)
Procedure Name: LMA Insertion Date/Time: 07/07/2014 12:54 PM Performed by: Burna CashONRAD, Eusebia Grulke C Pre-anesthesia Checklist: Patient identified, Emergency Drugs available, Suction available and Patient being monitored Patient Re-evaluated:Patient Re-evaluated prior to inductionOxygen Delivery Method: Circle System Utilized Preoxygenation: Pre-oxygenation with 100% oxygen Intubation Type: IV induction Ventilation: Mask ventilation without difficulty LMA: LMA inserted LMA Size: 4.0 Number of attempts: 1 Airway Equipment and Method: bite block Placement Confirmation: positive ETCO2 Tube secured with: Tape Dental Injury: Teeth and Oropharynx as per pre-operative assessment

## 2014-07-07 NOTE — Brief Op Note (Signed)
07/07/2014  1:10 PM  PATIENT:  Tiffany Prince  68 y.o. female  PRE-OPERATIVE DIAGNOSIS:  STENOSING TENOSYNOVITIS LEFT RING FINGER  POST-OPERATIVE DIAGNOSIS:  Stenosing Tenosynovitis left ring finger  PROCEDURE:  Procedure(s) with comments: RELEASE A-1 LEFT RING FINGER (Left) - ANESTHESIA:  GENERAL, IV REGIONAL FAB  SURGEON:  Surgeon(s) and Role:    * Cindee SaltGary Shanira Tine, MD - Primary  PHYSICIAN ASSISTANT:   ASSISTANTS: none   ANESTHESIA:   local and general  EBL:  Total I/O In: 400 [I.V.:400] Out: -   BLOOD ADMINISTERED:none  DRAINS: none   LOCAL MEDICATIONS USED:  BUPIVICAINE   SPECIMEN:  No Specimen  DISPOSITION OF SPECIMEN:  N/A  COUNTS:  YES  TOURNIQUET:   Total Tourniquet Time Documented: Forearm (Left) - 7 minutes Total: Forearm (Left) - 7 minutes   DICTATION: .Other Dictation: Dictation Number [id number]  PLAN OF CARE: Discharge to home after PACU  PATIENT DISPOSITION:  PACU - hemodynamically stable.

## 2014-07-07 NOTE — Discharge Instructions (Addendum)

## 2014-07-07 NOTE — Transfer of Care (Signed)
Immediate Anesthesia Transfer of Care Note  Patient: Tiffany Prince  Procedure(s) Performed: Procedure(s) with comments: RELEASE A-1 LEFT RING FINGER (Left) - ANESTHESIA:  GENERAL, IV REGIONAL FAB  Patient Location: PACU  Anesthesia Type:General  Level of Consciousness: awake, alert  and oriented  Airway & Oxygen Therapy: Patient Spontanous Breathing and Patient connected to face mask oxygen  Post-op Assessment: Report given to PACU RN and Post -op Vital signs reviewed and stable  Post vital signs: Reviewed and stable  Complications: No apparent anesthesia complications

## 2014-07-08 NOTE — Op Note (Signed)
NAMJeannett Senior:  Kos, Kaysey                 ACCOUNT NO.:  [id number]  MEDICAL RECORD NO.:  [id number]  LOCATION:                                 FACILITY:  PHYSICIAN:  Cindee SaltGary Little Winton, M.D.            DATE OF BIRTH:  DATE OF PROCEDURE:  07/07/2014 DATE OF DISCHARGE:                              OPERATIVE REPORT   PREOPERATIVE DIAGNOSIS:  Stenosing tenosynovitis, left ring finger.  POSTOPERATIVE DIAGNOSIS:  Stenosing tenosynovitis, left ring finger.  OPERATION:  Release of A1 pulley, left ring finger with excision of flexor sheath cyst.  SURGEON:  Cindee SaltGary Milt Coye, M.D.  ANESTHESIA:  General with local infiltration.  ANESTHESIOLOGIST:  Zenon MayoW. Edmond Fitzgerald, MD  HISTORY:  The patient is a 68 year old female with a history of triggering of her left ring finger, painful.  She has elected not to have conservative treatment, would like to have this surgically released, having been through these in the past.  Pre, peri, and postoperative course have been discussed along with risks and complications.  She is aware that there is no guarantee with the surgery; possibility of infection; recurrence of injury to arteries, nerves, tendons; incomplete relief of symptoms; dystrophy.  In the preoperative area, the patient is seen, the extremity marked by both patient and surgeon, and antibiotic given.  PROCEDURE IN DETAIL:  The patient was brought to the operating room, where a general anesthetic was carried out without difficulty.  She was prepped using ChloraPrep, supine position, left arm free.  A 3-minute dry time was allowed.  Time-out taken, confirming the patient and procedure.  An oblique incision was made over the A1 pulley of left ring finger after exsanguination with an Esmarch bandage.  Tourniquet placed on the forearm was inflated to 250 mmHg.  The retractor was placed protecting neurovascular bundles radially and ulnarly.  The A1 pulley was identified.  The cyst was present in its proximal  aspect, this was excised.  An incision was made on its radial aspect.  Small incision was made centrally in A2.  Partial tenosynovectomy was performed proximally. The tendons were separated to prevent any adhesions and break up any adhesions.  The finger placed through a full range motion, no further triggering was noted.  The wound was irrigated and the skin closed with interrupted 5-0 nylon sutures.  A sterile compressive dressing was applied after local infiltration with 0.25% bupivacaine without epinephrine, 5 mL was used.  On deflation of the tourniquet, all fingers were immediately pinked.  She was taken to the recovery room for observation in satisfactory condition.  She will be discharged to home to [facility] [place] in 1 week, on Vicodin.          ______________________________ Cindee SaltGary Khadejah Son, M.D.     GK/MEDQ  D:  07/07/2014  T:  07/08/2014  Job:  [id number]

## 2014-07-09 ENCOUNTER — Encounter (HOSPITAL_BASED_OUTPATIENT_CLINIC_OR_DEPARTMENT_OTHER): Payer: Self-pay | Admitting: Orthopedic Surgery

## 2014-07-10 ENCOUNTER — Telehealth: Payer: Self-pay | Admitting: Gynecology

## 2014-07-10 NOTE — Telephone Encounter (Signed)
Patient called to discuss her balance. Then, she asked how she could get a copy of her most recent labwork (it was done 10/13/13). Will you discuss with the patient the process for getting the labwork?

## 2014-07-10 NOTE — Telephone Encounter (Signed)
Patient aware she will need to sign a release. She will go to the website.

## 2014-07-10 NOTE — Telephone Encounter (Signed)
Left message for pt to call regarding request for labs. Pt will need to sign a records release.

## 2014-07-20 ENCOUNTER — Encounter: Payer: Self-pay | Admitting: Cardiovascular Disease

## 2014-07-21 ENCOUNTER — Other Ambulatory Visit: Payer: Self-pay | Admitting: *Deleted

## 2014-07-21 MED ORDER — VALSARTAN 160 MG PO TABS: 160.0000 mg | ORAL_TABLET | Freq: Every day | ORAL | Status: DC

## 2014-07-21 NOTE — Progress Notes (Unsigned)
Pt needed script for Diovan

## 2014-07-30 ENCOUNTER — Other Ambulatory Visit: Payer: Self-pay | Admitting: *Deleted

## 2014-07-30 DIAGNOSIS — I10 Essential (primary) hypertension: Secondary | ICD-10-CM

## 2014-07-30 MED ORDER — VALSARTAN 80 MG PO TABS: 80.0000 mg | ORAL_TABLET | Freq: Every day | ORAL | Status: DC

## 2014-08-04 ENCOUNTER — Other Ambulatory Visit: Payer: Self-pay | Admitting: *Deleted

## 2014-08-04 NOTE — Telephone Encounter (Signed)
Pt. RX denied to soon to be refilled.

## 2014-09-17 ENCOUNTER — Telehealth: Payer: Self-pay | Admitting: Cardiovascular Disease

## 2014-09-17 NOTE — Telephone Encounter (Signed)
Increase valsartan to 160 mg once daily

## 2014-09-17 NOTE — Telephone Encounter (Signed)
Recommended Dr. Erin Hearingroitoru's instructions to patient. She voiced understanding.

## 2014-09-17 NOTE — Telephone Encounter (Signed)
°  Tiffany Prince is calling because her  Blood pressure is still really high . The last time she took it .Marland Kitchen. It was 171/91.Marland Kitchen. Please call   Thanks

## 2014-09-17 NOTE — Telephone Encounter (Signed)
Pt of Dr. Hazle CocaBerry's, called in, highly anxious about elevated BP. Checked this AM, 171/91. Her readings yesterday at Orthopedic office were also high, 170-180/90-95  Other than anxiety, she denies symptoms.  She is currently taking Valsartan 80mg , once daily in the evening. I have a OV in November noting her dose to be 160mg  daily.  Advise to be seen here, increase her dose/freq, or add med?

## 2014-11-05 ENCOUNTER — Telehealth: Payer: Self-pay | Admitting: Cardiovascular Disease

## 2014-11-05 DIAGNOSIS — E785 Hyperlipidemia, unspecified: Secondary | ICD-10-CM

## 2014-11-05 DIAGNOSIS — Z79899 Other long term (current) drug therapy: Secondary | ICD-10-CM

## 2014-11-05 NOTE — Telephone Encounter (Signed)
Pt instructed that lab orders available for [facility]. She voiced understanding.

## 2014-11-05 NOTE — Telephone Encounter (Signed)
Tiffany Prince would like for a Lab order to be sent to her before her appt on  11/20/14 at 8:30am   Thanks

## 2014-11-17 ENCOUNTER — Telehealth: Payer: Self-pay | Admitting: Cardiovascular Disease

## 2014-11-17 DIAGNOSIS — Z79899 Other long term (current) drug therapy: Secondary | ICD-10-CM

## 2014-11-17 NOTE — Telephone Encounter (Signed)
I spoke with patient.  I ordered a bmp.  She will need to follow up with a hbg a1c thru a pcp.

## 2014-11-17 NOTE — Telephone Encounter (Signed)
Pt going to have lab,she know it was ordered.She wants you to add an A1C to it please.She is anticipating surgery in June [facility],that why she wants to have as much as possible before surgery.Also add any other that you think will be necessary.

## 2014-11-18 LAB — BASIC METABOLIC PANEL
BUN: 17 mg/dL (ref 6–23)
CHLORIDE: 101 meq/L (ref 96–112)
CO2: 29 mEq/L (ref 19–32)
Calcium: 9.9 mg/dL (ref 8.4–10.5)
Creat: 0.69 mg/dL (ref 0.50–1.10)
Glucose, Bld: 104 mg/dL — ABNORMAL HIGH (ref 70–99)
POTASSIUM: 5.2 meq/L (ref 3.5–5.3)
Sodium: 138 mEq/L (ref 135–145)

## 2014-11-20 ENCOUNTER — Encounter: Payer: Self-pay | Admitting: Cardiovascular Disease

## 2014-11-20 ENCOUNTER — Ambulatory Visit (INDEPENDENT_AMBULATORY_CARE_PROVIDER_SITE_OTHER): Payer: Medicare Other | Admitting: Cardiovascular Disease

## 2014-11-20 VITALS — BP 178/70 | HR 74 | Ht 64.0 in | Wt 186.0 lb

## 2014-11-20 DIAGNOSIS — Z01818 Encounter for other preprocedural examination: Secondary | ICD-10-CM

## 2014-11-20 DIAGNOSIS — Z8249 Family history of ischemic heart disease and other diseases of the circulatory system: Secondary | ICD-10-CM

## 2014-11-20 DIAGNOSIS — I10 Essential (primary) hypertension: Secondary | ICD-10-CM | POA: Diagnosis not present

## 2014-11-20 NOTE — Assessment & Plan Note (Signed)
History of hypertension blood pressure measured today at 170/70. She is on Diovan 160 mg a day and admits to dietary indiscretion with regard to salt. She also is in a lot of pain because of back issues. I've asked her to exclude soft diet, keep a daily blood pressure log. She'll see Belenda CruiseKristin back 1 month for review this and alteration of her medical regimen is appropriate.

## 2014-11-20 NOTE — Assessment & Plan Note (Signed)
History of hyperlipidemia on some statin 40 mg a day followed by her PCP 

## 2014-11-20 NOTE — Patient Instructions (Signed)
Dr Allyson SabalBerry recommends that you check your blood pressure daily. Please keep a record of your readings on the log sheet provided.  Please schedule an appointment in 1 month with our pharmacist, Phillips HayKristin Alvstad, PharmD for a blood pressure check.  Your physician has requested that you have a lexiscan myoview. For further information please visit [email or web address]. Please follow instruction sheet, as given.  Dr Allyson SabalBerry recommends that you schedule a follow-up appointment in 3 months.

## 2014-11-20 NOTE — Progress Notes (Signed)
11/20/2014 Tiffany Prince   04-Nov-1945  [id number]  Primary Physician Runell Gess, MD Primary Cardiologist: Runell Gess MD Tiffany Prince   HPI:  [name] is a very pleasant 69 year old moderately overweight single Caucasian female mother of 3 children, grandmother and 4 grandchildren who works as an Advertising account planner. She was a patient of Dr. Rocco Serene. She has no past medical cardiac history. Her risk factors include treated hypertension and hyperlipidemia. She does have a family history of heart disease with a brother who died of a myocardial infarction at age 74 and a mother who had an MI as well. She has never had a heart attack or stroke. She denies chest pain or shortness of breath. Her last Myoview stress test performed 04/15/10 was nonischemic. Her blood pressure has been elevated recently most likely because of dietary indiscretion and chronic back pain. She scheduled to have breast reduction surgery at [facility] in the upcoming future.   Current Outpatient Prescriptions  Medication Sig Dispense Refill  . buPROPion (WELLBUTRIN XL) 300 MG 24 hr tablet Take 1 tablet (300 mg total) by mouth daily. 90 tablet 2  . calcium gluconate 650 MG tablet Take 650 mg by mouth daily.    . diclofenac (VOLTAREN) 75 MG EC tablet     . DULoxetine (CYMBALTA) 30 MG capsule Take 1 capsule by mouth as needed.    Marland Kitchen HYDROcodone-acetaminophen (NORCO) 5-325 MG per tablet Take 1 tablet by mouth every 6 (six) hours as needed for moderate pain. 30 tablet 0  . simvastatin (ZOCOR) 20 MG tablet Take 1 tablet ( ) in addition to 1-40mg  tablet (  total) once daily by mouth for cholesterol. 90 tablet 3  . simvastatin (ZOCOR) 40 MG tablet Take one tablet by mouth one time daily. MUST keep appointment for additional refills. 90 tablet 3  . zolpidem (AMBIEN) 10 MG tablet Take 1 tablet (10 mg total) by mouth at bedtime as needed for sleep. 90 tablet 1   No current facility-administered  medications for this visit.    Allergies  Allergen Reactions  . Tetracyclines & Related Shortness Of Breath    History   Social History  . Marital Status: Divorced    Spouse Name: N/A  . Number of Children: N/A  . Years of Education: N/A   Occupational History  . Not on file.   Social History Main Topics  . Smoking status: Never Smoker   . Smokeless tobacco: Never Used     Comment: pt stated that she smoked 50 years ago, and barely  . Alcohol Use: No  . Drug Use: No  . Sexual Activity: Not Currently   Other Topics Concern  . Not on file   Social History Narrative     Review of Systems: General: negative for chills, fever, night sweats or weight changes.  Cardiovascular: negative for chest pain, dyspnea on exertion, edema, orthopnea, palpitations, paroxysmal nocturnal dyspnea or shortness of breath Dermatological: negative for rash Respiratory: negative for cough or wheezing Urologic: negative for hematuria Abdominal: negative for nausea, vomiting, diarrhea, bright red blood per rectum, melena, or hematemesis Neurologic: negative for visual changes, syncope, or dizziness All other systems reviewed and are otherwise negative except as noted above.    Blood pressure 178/70, pulse 74, height  (1.626 m), weight 186 lb (84.369 kg).  General appearance: alert and no distress Neck: no adenopathy, no carotid bruit, no JVD, supple, symmetrical, trachea midline and thyroid not enlarged, symmetric, no tenderness/mass/nodules Lungs: clear to auscultation  bilaterally Heart: regular rate and rhythm, S1, S2 normal, no murmur, click, rub or gallop Extremities: extremities normal, atraumatic, no cyanosis or edema  EKG normal sinus rhythm at 74 without ST or T-wave changes. I personally reviewed this EKG  ASSESSMENT AND PLAN:   Pure hypercholesterolemia History of hyperlipidemia on some statin 40 mg a day followed by her PCP   Essential hypertension, benign History of  hypertension blood pressure measured today at 170/70. She is on Diovan 160 mg a day and admits to dietary indiscretion with regard to salt. She also is in a lot of pain because of back issues. I've asked her to exclude soft diet, keep a daily blood pressure log. She'll see Belenda CruiseKristin back 1 month for review this and alteration of her medical regimen is appropriate.       Runell GessJonathan J. Berta Denson MD FACP,FACC,FAHA, [facility] 11/20/2014 9:34 AM

## 2014-12-03 ENCOUNTER — Telehealth (HOSPITAL_COMMUNITY): Payer: Self-pay

## 2014-12-03 NOTE — Telephone Encounter (Signed)
Encounter complete. 

## 2014-12-04 ENCOUNTER — Telehealth (HOSPITAL_COMMUNITY): Payer: Self-pay

## 2014-12-04 ENCOUNTER — Encounter (HOSPITAL_COMMUNITY): Payer: Medicare Other

## 2014-12-04 NOTE — Telephone Encounter (Signed)
Encounter complete. 

## 2014-12-08 ENCOUNTER — Ambulatory Visit (HOSPITAL_COMMUNITY)
Admission: RE | Admit: 2014-12-08 | Discharge: 2014-12-08 | Disposition: A | Discharge status: Home / Self Care | Payer: Medicare Other | Source: Ambulatory Visit | Attending: Cardiovascular Disease | Admitting: Cardiovascular Disease

## 2014-12-08 DIAGNOSIS — Z01818 Encounter for other preprocedural examination: Secondary | ICD-10-CM

## 2014-12-08 DIAGNOSIS — Z8249 Family history of ischemic heart disease and other diseases of the circulatory system: Secondary | ICD-10-CM

## 2014-12-08 DIAGNOSIS — I10 Essential (primary) hypertension: Secondary | ICD-10-CM | POA: Insufficient documentation

## 2014-12-08 DIAGNOSIS — Z01810 Encounter for preprocedural cardiovascular examination: Secondary | ICD-10-CM | POA: Diagnosis not present

## 2014-12-08 DIAGNOSIS — I2510 Atherosclerotic heart disease of native coronary artery without angina pectoris: Secondary | ICD-10-CM

## 2014-12-08 LAB — MYOCARDIAL PERFUSION IMAGING
CHL CUP NUCLEAR SDS: 12
CHL CUP NUCLEAR SSS: 13
CHL CUP STRESS STAGE 1 GRADE: 0 %
CHL CUP STRESS STAGE 1 SBP: 146 mmHg
CHL CUP STRESS STAGE 2 HR: 66 {beats}/min
CHL CUP STRESS STAGE 3 HR: 85 {beats}/min
CHL CUP STRESS STAGE 4 GRADE: 0 %
CHL CUP STRESS STAGE 4 HR: 71 {beats}/min
CHL CUP STRESS STAGE 4 SPEED: 0 mph
CSEPPBP: 148 mmHg
CSEPPHR: 85 {beats}/min
CSEPPMHR: 56 %
Estimated workload: 1 METS
LV dias vol: 69 mL
LV sys vol: 17 mL
Nuc Stress EF: 75 %
Rest BP: 146 mmHg
Rest HR: 64 {beats}/min
SRS: 1
Stage 1 DBP: 67 mmHg
Stage 1 HR: 65 {beats}/min
Stage 1 Speed: 0 mph
Stage 2 Grade: 0 %
Stage 2 Speed: 0 mph
Stage 3 DBP: 60 mmHg
Stage 3 Grade: 0 %
Stage 3 SBP: 148 mmHg
Stage 3 Speed: 0 mph
Stage 4 DBP: 63 mmHg
Stage 4 SBP: 146 mmHg
TID: 1.02

## 2014-12-08 MED ORDER — REGADENOSON 0.4 MG/5ML IV SOLN
0.4000 mg | Freq: Once | INTRAVENOUS | Status: AC
  Administered 2014-12-08: 0.4 mg via INTRAVENOUS

## 2014-12-08 MED ORDER — TECHNETIUM TC 99M SESTAMIBI GENERIC - CARDIOLITE
10.0000 | Freq: Once | INTRAVENOUS | Status: AC | PRN
  Administered 2014-12-08: 10 via INTRAVENOUS

## 2014-12-08 MED ORDER — TECHNETIUM TC 99M SESTAMIBI GENERIC - CARDIOLITE
31.8000 | Freq: Once | INTRAVENOUS | Status: AC | PRN
  Administered 2014-12-08: 31.8 via INTRAVENOUS

## 2014-12-18 ENCOUNTER — Ambulatory Visit: Payer: Medicare Other | Admitting: Pharmacist Clinician (PhC)/ Clinical Pharmacy Specialist

## 2014-12-25 ENCOUNTER — Ambulatory Visit: Payer: Medicare Other | Admitting: Pharmacist Clinician (PhC)/ Clinical Pharmacy Specialist

## 2015-02-19 ENCOUNTER — Ambulatory Visit: Payer: Medicare Other | Admitting: Cardiovascular Disease

## 2015-08-13 ENCOUNTER — Other Ambulatory Visit: Payer: Self-pay

## 2015-08-13 ENCOUNTER — Other Ambulatory Visit: Payer: Self-pay | Admitting: Cardiovascular Disease

## 2015-08-13 ENCOUNTER — Telehealth: Payer: Self-pay | Admitting: Cardiovascular Disease

## 2015-08-13 MED ORDER — VALSARTAN 160 MG PO TABS: 160.0000 mg | ORAL_TABLET | Freq: Every day | ORAL | Status: DC

## 2015-08-13 NOTE — Telephone Encounter (Signed)
Rx(s) sent to pharmacy electronically.  

## 2015-08-16 NOTE — Telephone Encounter (Signed)
Closed encounter °

## 2015-09-28 ENCOUNTER — Encounter: Payer: Self-pay | Admitting: Cardiovascular Disease

## 2015-09-28 ENCOUNTER — Ambulatory Visit (INDEPENDENT_AMBULATORY_CARE_PROVIDER_SITE_OTHER): Payer: Medicare Other | Admitting: Cardiovascular Disease

## 2015-09-28 VITALS — BP 144/66 | HR 72 | Ht 64.0 in | Wt 183.0 lb

## 2015-09-28 DIAGNOSIS — E785 Hyperlipidemia, unspecified: Secondary | ICD-10-CM | POA: Diagnosis not present

## 2015-09-28 DIAGNOSIS — Z79899 Other long term (current) drug therapy: Secondary | ICD-10-CM

## 2015-09-28 DIAGNOSIS — I10 Essential (primary) hypertension: Secondary | ICD-10-CM | POA: Diagnosis not present

## 2015-09-28 MED ORDER — SIMVASTATIN 40 MG PO TABS: ORAL_TABLET | ORAL | Status: DC

## 2015-09-28 MED ORDER — VALSARTAN 160 MG PO TABS: 160.0000 mg | ORAL_TABLET | Freq: Every day | ORAL | Status: DC

## 2015-09-28 MED ORDER — SIMVASTATIN 20 MG PO TABS: ORAL_TABLET | ORAL | Status: DC

## 2015-09-28 NOTE — Patient Instructions (Signed)
Medication Instructions:  Your physician recommends that you continue on your current medications as directed. Please refer to the Current Medication list given to you today.   Labwork: Your physician recommends that you return for lab work in: FASTING The lab can be found on the FIRST FLOOR of out building in [address]   Testing/Procedures: none  Follow-Up: Your physician wants you to follow-up in: 12 months with Dr. Berry. You will receive a reminder letter in the mail two months in advance. If you don't receive a letter, please call our office to schedule the follow-up appointment.   Any Other Special Instructions Will Be Listed Below (If Applicable).     If you need a refill on your cardiac medications before your next appointment, please call your pharmacy.   

## 2015-09-28 NOTE — Progress Notes (Signed)
09/28/2015 Glendora ScoreArlene Uliano   09-16-1945  [id number]  Primary Physician No PCP Per Patient Primary Cardiologist: Runell GessJonathan J. Lillion Elbert MD Roseanne RenoFACP,FACC,FAHA, FSCAI   HPI:  [name] is a very pleasant 70 year old moderately overweight single Caucasian female mother of 3 children, grandmother and 4 grandchildren who works as an Advertising account plannerinsurance agent. [place] since I saw her last one year ago.She was a patient of Dr. Rocco Sereneichard Weintraub's. She has no past medical cardiac history. Her risk factors include treated hypertension and hyperlipidemia. She does have a family history of heart disease with a brother who died of a myocardial infarction at age 72 and a mother who had an MI as well. She has never had a heart attack or stroke. She denies chest pain or shortness of breath. Her last Myoview stress test performed 04/15/10 was nonischemic. Her blood pressure has been elevated recently most likely because of dietary indiscretion and chronic back pain.she has successful breast [facility] surgery [facility] last [facility]. She denies chest pain or shortness of breath.  Current Outpatient Prescriptions  Medication Sig Dispense Refill  . aspirin 81 MG tablet Take 81 mg by mouth daily.    Marland Kitchen. buPROPion (WELLBUTRIN XL) 300 MG 24 hr tablet Take 1 tablet (300 mg total) by mouth daily. 90 tablet 2  . calcium gluconate 650 MG tablet Take 650 mg by mouth daily.    . DULoxetine (CYMBALTA) 30 MG capsule Take 90 mg by mouth as needed.     Marland Kitchen. HYDROcodone-acetaminophen (NORCO) 5-325 MG per tablet Take 1 tablet by mouth every 6 (six) hours as needed for moderate pain. 30 tablet 0  . simvastatin (ZOCOR) 20 MG tablet TAKE ONE TABLET BY MOUTH ONE TIME DAILY, IN ADDITION TO THE 40MG  TABLET. 30 tablet 6  . simvastatin (ZOCOR) 40 MG tablet TAKE 1 TABLET BY MOUTH ONE TIME A DAY 30 tablet 6  . valsartan (DIOVAN) 160 MG tablet Take 1 tablet (160 mg total) by mouth daily. 90 tablet 3  . zolpidem  (AMBIEN CR) 6.25 MG CR tablet Take 6.25 mg by mouth daily.     No current facility-administered medications for this visit.    Allergies  Allergen Reactions  . Tetracyclines & Related Shortness Of Breath    Social History   Social History  . Marital Status: Divorced    Spouse Name: N/A  . Number of Children: N/A  . Years of Education: N/A   Occupational History  . Not on file.   Social History Main Topics  . Smoking status: Never Smoker   . Smokeless tobacco: Never Used     Comment: pt stated that she smoked 50 years ago, and barely  . Alcohol Use: No  . Drug Use: No  . Sexual Activity: Not Currently   Other Topics Concern  . Not on file   Social History Narrative     Review of Systems: General: negative for chills, fever, night sweats or weight changes.  Cardiovascular: negative for chest pain, dyspnea on exertion, edema, orthopnea, palpitations, paroxysmal nocturnal dyspnea or shortness of breath Dermatological: negative for rash Respiratory: negative for cough or wheezing Urologic: negative for hematuria Abdominal: negative for nausea, vomiting, diarrhea, bright red blood per rectum, melena, or hematemesis Neurologic: negative for visual changes, syncope, or dizziness All other systems reviewed and are otherwise negative except as noted above.    Blood pressure 144/66, pulse 72, height 5\' 4"  (1.626 m), weight 183 lb (83.008  kg).  General appearance: alert and no distress Neck: no adenopathy, no carotid bruit, no JVD, supple, symmetrical, trachea midline and thyroid not enlarged, symmetric, no tenderness/mass/nodules Lungs: clear to auscultation bilaterally Heart: regular rate and rhythm, S1, S2 normal, no murmur, click, rub or gallop Extremities: extremities normal, atraumatic, no cyanosis or edema  EKG not performed today  ASSESSMENT AND PLAN:   Essential hypertension, benign History of hypertension blood pressure measured today at 144/66. She is on  Diovan. Continue current meds at current dosing  Pure hypercholesterolemia History of hyperlipidemia on statin therapy. We will recheck a lipid profile      Runell Gess MD [facility], [facility] 09/28/2015 4:10 PM

## 2015-09-28 NOTE — Assessment & Plan Note (Signed)
History of hyperlipidemia on statin therapy. We will recheck a lipid profile 

## 2015-09-28 NOTE — Assessment & Plan Note (Signed)
History of hypertension blood pressure measured today at 144/66. She is on Diovan. Continue current meds at current dosing

## 2015-09-29 LAB — CBC WITH DIFFERENTIAL/PLATELET
Basophils Absolute: 0 10*3/uL (ref 0.0–0.1)
Basophils Relative: 0 % (ref 0–1)
Eosinophils Absolute: 0.1 10*3/uL (ref 0.0–0.7)
Eosinophils Relative: 1 % (ref 0–5)
HCT: 37.2 % (ref 36.0–46.0)
HEMOGLOBIN: 12.1 g/dL (ref 12.0–15.0)
LYMPHS PCT: 29 % (ref 12–46)
Lymphs Abs: 3.8 10*3/uL (ref 0.7–4.0)
MCH: 26 pg (ref 26.0–34.0)
MCHC: 32.5 g/dL (ref 30.0–36.0)
MCV: 80 fL (ref 78.0–100.0)
MONO ABS: 1 10*3/uL (ref 0.1–1.0)
MONOS PCT: 8 % (ref 3–12)
MPV: 10.3 fL (ref 8.6–12.4)
NEUTROS ABS: 8.1 10*3/uL — AB (ref 1.7–7.7)
Neutrophils Relative %: 62 % (ref 43–77)
PLATELETS: 345 10*3/uL (ref 150–400)
RBC: 4.65 MIL/uL (ref 3.87–5.11)
RDW: 14 % (ref 11.5–15.5)
WBC: 13.1 10*3/uL — ABNORMAL HIGH (ref 4.0–10.5)

## 2015-09-29 LAB — COMPREHENSIVE METABOLIC PANEL
ALT: 15 U/L (ref 6–29)
AST: 15 U/L (ref 10–35)
Albumin: 4 g/dL (ref 3.6–5.1)
Alkaline Phosphatase: 76 U/L (ref 33–130)
BUN: 21 mg/dL (ref 7–25)
CO2: 29 mmol/L (ref 20–31)
Calcium: 9.5 mg/dL (ref 8.6–10.4)
Chloride: 103 mmol/L (ref 98–110)
Creat: 0.79 mg/dL (ref 0.60–0.93)
Glucose, Bld: 79 mg/dL (ref 65–99)
Potassium: 5.2 mmol/L (ref 3.5–5.3)
Sodium: 141 mmol/L (ref 135–146)
Total Bilirubin: 0.3 mg/dL (ref 0.2–1.2)
Total Protein: 6.9 g/dL (ref 6.1–8.1)

## 2015-09-29 LAB — LIPID PANEL
Cholesterol: 171 mg/dL (ref 125–200)
HDL: 72 mg/dL (ref >=46)
LDL Cholesterol: 76 mg/dL (ref <130)
Total CHOL/HDL Ratio: 2.4 Ratio (ref <=5.0)
Triglycerides: 114 mg/dL (ref <150)
VLDL: 23 mg/dL (ref <30)

## 2015-09-29 LAB — T4, FREE: Free T4: 1.1 ng/dL (ref 0.8–1.8)

## 2015-09-29 LAB — MAGNESIUM: Magnesium: 2 mg/dL (ref 1.5–2.5)

## 2015-09-29 LAB — TSH: TSH: 0.81 mIU/L

## 2015-09-30 LAB — HEMOGLOBIN A1C
Hgb A1c MFr Bld: 6 % — ABNORMAL HIGH (ref <5.7)
Mean Plasma Glucose: 126 mg/dL — ABNORMAL HIGH (ref <117)

## 2015-12-30 IMAGING — NM NM MISC PROCEDURE
6 series · 36 of 36 positions shown · non-contrast
Comparison: none

[Series 1: wbr rest · 6.40mm/px · 6 of 64 frames shown]
[frame 6/64]
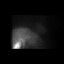
[frame 16/64]
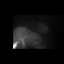
[frame 27/64]
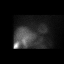
[frame 38/64]
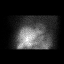
[frame 48/64]
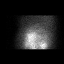
[frame 59/64]
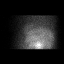

[Series 1: wbr_r-proj_st wbr rest · 6.40mm/px · 6 of 64 frames shown]
[frame 6/64]
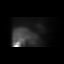
[frame 16/64]
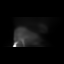
[frame 27/64]
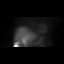
[frame 38/64]
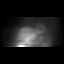
[frame 48/64]
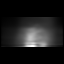
[frame 59/64]
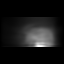

[Series 2: wbr stress-gsp · 6.40mm/px · 6 of 512 frames shown]
[frame 43/512]
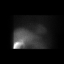
[frame 128/512]
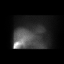
[frame 214/512]
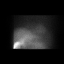
[frame 299/512]
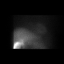
[frame 384/512]
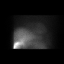
[frame 470/512]
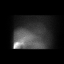

[Series 2: wbr_s-proj_st wbr stress-gsp · 6.40mm/px · 6 of 512 frames shown]
[frame 43/512]
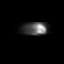
[frame 128/512]
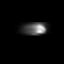
[frame 214/512]
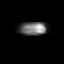
[frame 299/512]
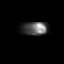
[frame 384/512]
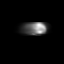
[frame 470/512]
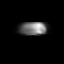

[Series 3: wbr_s-proj_st wbr stress-sum-em · 6.40mm/px · 6 of 64 frames shown]
[frame 6/64]
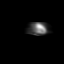
[frame 16/64]
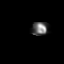
[frame 27/64]
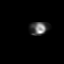
[frame 38/64]
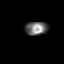
[frame 48/64]
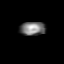
[frame 59/64]
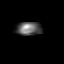

[Series 3: wbr stress-sum-em · 6.40mm/px · 6 of 64 frames shown]
[frame 6/64]
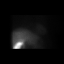
[frame 16/64]
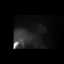
[frame 27/64]
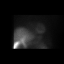
[frame 38/64]
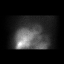
[frame 48/64]
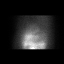
[frame 59/64]
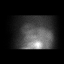

[36 of 36 positions shown; findings below may reference images not displayed]

Canned report from images found in remote index.

Refer to host system for actual result text.

## 2016-10-22 ENCOUNTER — Other Ambulatory Visit: Payer: Self-pay | Admitting: Cardiovascular Disease

## 2016-10-29 ENCOUNTER — Other Ambulatory Visit: Payer: Self-pay | Admitting: Cardiovascular Disease

## 2016-10-29 DIAGNOSIS — Z79899 Other long term (current) drug therapy: Secondary | ICD-10-CM

## 2016-10-29 DIAGNOSIS — I10 Essential (primary) hypertension: Secondary | ICD-10-CM

## 2016-10-29 DIAGNOSIS — E785 Hyperlipidemia, unspecified: Secondary | ICD-10-CM

## 2016-11-30 ENCOUNTER — Other Ambulatory Visit: Payer: Self-pay | Admitting: *Deleted

## 2016-11-30 MED ORDER — VALSARTAN 160 MG PO TABS: 160.0000 mg | ORAL_TABLET | Freq: Every day | ORAL | 0 refills | Status: DC

## 2016-12-01 ENCOUNTER — Other Ambulatory Visit: Payer: Self-pay | Admitting: Cardiovascular Disease

## 2016-12-23 ENCOUNTER — Other Ambulatory Visit: Payer: Self-pay | Admitting: Cardiovascular Disease

## 2017-01-29 ENCOUNTER — Other Ambulatory Visit: Payer: Self-pay | Admitting: Cardiovascular Disease

## 2017-01-29 DIAGNOSIS — E785 Hyperlipidemia, unspecified: Secondary | ICD-10-CM

## 2017-01-29 DIAGNOSIS — Z79899 Other long term (current) drug therapy: Secondary | ICD-10-CM

## 2017-01-29 DIAGNOSIS — I10 Essential (primary) hypertension: Secondary | ICD-10-CM

## 2017-01-30 ENCOUNTER — Other Ambulatory Visit: Payer: Self-pay | Admitting: Cardiovascular Disease

## 2017-01-30 DIAGNOSIS — Z79899 Other long term (current) drug therapy: Secondary | ICD-10-CM

## 2017-01-30 DIAGNOSIS — I10 Essential (primary) hypertension: Secondary | ICD-10-CM

## 2017-01-30 DIAGNOSIS — E785 Hyperlipidemia, unspecified: Secondary | ICD-10-CM
